# Patient Record
Sex: Female | Born: 1955 | Race: White | Hispanic: No | Marital: Married | Smoking: Never smoker
Health system: Southern US, Community
[De-identification: ages and names within clinical notes are randomized; demographics above are authoritative.]

## PROBLEM LIST (undated history)

## (undated) DIAGNOSIS — K219 Gastro-esophageal reflux disease without esophagitis: Secondary | ICD-10-CM

## (undated) DIAGNOSIS — F419 Anxiety disorder, unspecified: Secondary | ICD-10-CM

## (undated) DIAGNOSIS — K5790 Diverticulosis of intestine, part unspecified, without perforation or abscess without bleeding: Secondary | ICD-10-CM

## (undated) DIAGNOSIS — K222 Esophageal obstruction: Secondary | ICD-10-CM

## (undated) DIAGNOSIS — E7800 Pure hypercholesterolemia, unspecified: Secondary | ICD-10-CM

## (undated) HISTORY — DX: Esophageal obstruction: K22.2

## (undated) HISTORY — DX: Diverticulosis of intestine, part unspecified, without perforation or abscess without bleeding: K57.90

## (undated) HISTORY — PX: CYST EXCISION: SHX5701

## (undated) HISTORY — PX: ECTOPIC PREGNANCY SURGERY: SHX613

## (undated) HISTORY — PX: CHOLECYSTECTOMY: SHX55

---

## 2017-04-07 ENCOUNTER — Emergency Department (HOSPITAL_BASED_OUTPATIENT_CLINIC_OR_DEPARTMENT_OTHER)
Admission: EM | Admit: 2017-04-07 | Discharge: 2017-04-07 | Disposition: A | Discharge status: Home / Self Care | Payer: 59 | Attending: Emergency Medicine | Admitting: Emergency Medicine

## 2017-04-07 ENCOUNTER — Encounter (HOSPITAL_BASED_OUTPATIENT_CLINIC_OR_DEPARTMENT_OTHER): Payer: Self-pay | Admitting: *Deleted

## 2017-04-07 ENCOUNTER — Emergency Department (HOSPITAL_BASED_OUTPATIENT_CLINIC_OR_DEPARTMENT_OTHER): Payer: 59

## 2017-04-07 DIAGNOSIS — Y999 Unspecified external cause status: Secondary | ICD-10-CM | POA: Diagnosis not present

## 2017-04-07 DIAGNOSIS — S4992XA Unspecified injury of left shoulder and upper arm, initial encounter: Secondary | ICD-10-CM | POA: Insufficient documentation

## 2017-04-07 DIAGNOSIS — S0090XA Unspecified superficial injury of unspecified part of head, initial encounter: Secondary | ICD-10-CM | POA: Insufficient documentation

## 2017-04-07 DIAGNOSIS — Z79899 Other long term (current) drug therapy: Secondary | ICD-10-CM | POA: Diagnosis not present

## 2017-04-07 DIAGNOSIS — W01198A Fall on same level from slipping, tripping and stumbling with subsequent striking against other object, initial encounter: Secondary | ICD-10-CM | POA: Insufficient documentation

## 2017-04-07 DIAGNOSIS — S20219A Contusion of unspecified front wall of thorax, initial encounter: Secondary | ICD-10-CM | POA: Diagnosis not present

## 2017-04-07 DIAGNOSIS — Y9301 Activity, walking, marching and hiking: Secondary | ICD-10-CM | POA: Insufficient documentation

## 2017-04-07 DIAGNOSIS — S59912A Unspecified injury of left forearm, initial encounter: Secondary | ICD-10-CM | POA: Diagnosis not present

## 2017-04-07 DIAGNOSIS — Y9289 Other specified places as the place of occurrence of the external cause: Secondary | ICD-10-CM | POA: Insufficient documentation

## 2017-04-07 DIAGNOSIS — S20301A Unspecified superficial injuries of right front wall of thorax, initial encounter: Secondary | ICD-10-CM | POA: Diagnosis present

## 2017-04-07 DIAGNOSIS — S6992XA Unspecified injury of left wrist, hand and finger(s), initial encounter: Secondary | ICD-10-CM | POA: Diagnosis not present

## 2017-04-07 DIAGNOSIS — S0990XA Unspecified injury of head, initial encounter: Secondary | ICD-10-CM

## 2017-04-07 DIAGNOSIS — W19XXXA Unspecified fall, initial encounter: Secondary | ICD-10-CM

## 2017-04-07 DIAGNOSIS — T148XXA Other injury of unspecified body region, initial encounter: Secondary | ICD-10-CM

## 2017-04-07 DIAGNOSIS — S20211A Contusion of right front wall of thorax, initial encounter: Secondary | ICD-10-CM

## 2017-04-07 HISTORY — DX: Gastro-esophageal reflux disease without esophagitis: K21.9

## 2017-04-07 HISTORY — DX: Pure hypercholesterolemia, unspecified: E78.00

## 2017-04-07 HISTORY — DX: Anxiety disorder, unspecified: F41.9

## 2017-04-07 MED ORDER — HYDROCODONE-ACETAMINOPHEN 5-325 MG PO TABS: 2.0000 | ORAL_TABLET | ORAL | 0 refills | Status: AC | PRN

## 2017-04-07 MED ORDER — FENTANYL CITRATE (PF) 100 MCG/2ML IJ SOLN
100.0000 ug | Freq: Once | INTRAMUSCULAR | Status: AC
  Administered 2017-04-07: 100 ug via INTRAMUSCULAR
  Filled 2017-04-07: qty 2

## 2017-04-07 NOTE — ED Notes (Signed)
PA at bedside.

## 2017-04-07 NOTE — ED Triage Notes (Signed)
Pt reports falling when walking today-states that she tripped over something on the ground and had high heels on.  States generalized pain.  Right rib bruising and point tenderness on palpation.  Old bruising noted on abdomen.  Pt reports hitting her head-very small laceration with no bleeding to right forehead-unknown LOC.  Also reports left ring finger pain with bruising and slight swelling.

## 2017-04-07 NOTE — Discharge Instructions (Signed)
He did take pain medication as needed for the pain. A brace may be helpful to help with the rib pain. Apply ice to the area.  Keep the abrasion on her forehead clean and dry. You can apply Neosporin or bacitracin ointment.   Return the emergency Department for any worsening pain, dizziness, confusion, vomiting, loss of consciousness or any other worsening or concerning symptoms.

## 2017-04-07 NOTE — ED Provider Notes (Signed)
[facility] Provider Note   CSN: [id number] Arrival date & time: 04/07/17  1315     History   Chief Complaint Chief Complaint  Patient presents with  . Fall    HPI Meghan Roberts is a 61 y.o. female who presents after a witnessed mechanical fall that occurred at 12:30 pm today. Patient states she was walking when she tripped over her shoe causing her to fall forward on a gravel driveway. She denies any preceding dizziness or chest pain prior to fall. Family states that patient was initially dazed afterward but no true loss of consciousness. The patient is able to remember the details of the fall. Since then, patient reports pain to the right lateral chest wall that is worsened with any movement and deep inspiration. She also reports pain to left shoulder, forearm and left 4th digit. Patient is not currently on any blood thinners. Family and patient deny any abnormal behavior and patient has not had any vomiting since episode. She denies any vision changes, confusion, headache dizziness, chest pain difficulty breathing, abdominal pain, nausea/vomiting.  The history is provided by the patient.    Past Medical History:  Diagnosis Date  . Anxiety   . GERD (gastroesophageal reflux disease)   . Hypercholesteremia     There are no active problems to display for this patient.   Past Surgical History:  Procedure Laterality Date  . CHOLECYSTECTOMY    . CYST EXCISION    . ECTOPIC PREGNANCY SURGERY      OB History    No data available       Home Medications    Prior to Admission medications   Medication Sig Start Date End Date Taking? Authorizing Provider  atorvastatin (LIPITOR) 10 MG tablet Take 10 mg by mouth daily.   Yes [provider]  escitalopram (LEXAPRO) 20 MG tablet Take 20 mg by mouth daily.   Yes [provider]  omeprazole (PRILOSEC) 40 MG capsule Take 40 mg by mouth daily.   Yes [provider]  HYDROcodone-acetaminophen  (NORCO/VICODIN) 5-325 MG tablet Take 2 tablets by mouth every 4 (four) hours as needed. 04/07/17   Maxwell CaulLayden, Lindsey A, PA-C    Family History History reviewed. No pertinent family history.  Social History Social History  Substance Use Topics  . Smoking status: Never Smoker  . Smokeless tobacco: Never Used  . Alcohol use Yes     Allergies   Codeine; Flagyl [metronidazole]; and Penicillins   Review of Systems Review of Systems  Eyes: Negative for visual disturbance.  Respiratory: Negative for shortness of breath.   Cardiovascular: Positive for chest pain (Chest wall).  Gastrointestinal: Negative for abdominal pain, nausea and vomiting.  Musculoskeletal: Negative for back pain and neck pain.  Neurological: Negative for dizziness and headaches.  Psychiatric/Behavioral: Negative for behavioral problems and confusion.     Physical Exam Updated Vital Signs BP 103/63 (BP Location: Left Arm)   Pulse 76   Temp 98.4 F (36.9 C) (Oral)   Resp 18   Ht 5\' 4"  (1.626 m)   Wt 77.1 kg   SpO2 96%   BMI 29.18 kg/m   Physical Exam  Constitutional: She is oriented to person, place, and time. She appears well-developed and well-nourished.  Sitting on examination table. Appears anxious.  HENT:  Head: Normocephalic. Head is with abrasion.  Superficial abrasion to right frontal scalp. No skull deformity or crepitus. No racoon's eyes or battles signs. No hemotympanum bilaterally. No open wounds, lacerations.   Eyes:  Conjunctivae, EOM and lids are normal. Pupils are equal, round, and reactive to light. Right conjunctiva is not injected. Left conjunctiva is not injected.  Neck: Full passive range of motion without pain. No spinous process tenderness present. Normal range of motion present.  Flexion/extension and lateral movement of neck fully intact without difficulty. No midline bony tenderness. No deformity, crepitus.   Cardiovascular: Normal rate, regular rhythm and normal pulses.    Pulses:      Radial pulses are 2+ on the right side, and 2+ on the left side.  Pulmonary/Chest: Effort normal and breath sounds normal. She has no decreased breath sounds. She exhibits tenderness. She exhibits no crepitus, no deformity and no retraction.    Tenderness to palpation to the lateral aspect of the right chest wall. No retractions of the ribs, no deformities, no crepitus.  No evidence of respiratory distress. Able to speak in full sentences without difficulty.   Abdominal: Normal appearance and bowel sounds are normal. There is no tenderness. There is no rigidity, no rebound, no guarding and no CVA tenderness.  Musculoskeletal:       Cervical back: She exhibits no tenderness.       Thoracic back: She exhibits no tenderness.       Lumbar back: She exhibits no tenderness.  No midline tenderness to C, T, or L spine. Normal ROM of RUE. Tenderness to palpation to left shoulder and forearm. Full adduction of LUE without difficulty. Full abduction intact but with subjective reports of pain. No deformity, crepitus to LUE. Tenderness to palpation of the left 4th digit with overlying ecchymosis. FROM of left wrist. Normal right and left lower extremities.   Neurological: She is alert and oriented to person, place, and time. GCS eye subscore is 4. GCS verbal subscore is 5. GCS motor subscore is 6.  Cranial nerves III-XII intact Follows commands, Moves all extremities  5/5 strength to BUE and BLE  Sensation intact throughout  Normal finger to nose. No dysdiadochokinesia. No pronator drift. No slurred speech. No facial droop.   Skin: Skin is warm. Capillary refill takes less than 2 seconds. Abrasion noted.  Superficial abrasion to lateral right chest wall  Psychiatric: Her speech is normal. Her mood appears anxious.  Nursing note and vitals reviewed.   ED Treatments / Results  Labs (all labs ordered are listed, but only abnormal results are displayed) Labs Reviewed - No data to  display  EKG  EKG Interpretation None       Radiology No results found.  Procedures Procedures (including critical care time)  Medications Ordered in ED Medications  fentaNYL (SUBLIMAZE) injection 100 mcg (100 mcg Intramuscular Given 04/07/17 1517)     Initial Impression / Assessment and Plan / ED Course  I have reviewed the triage vital signs and the nursing notes.  Pertinent labs & imaging results that were available during my care of the patient were reviewed by me and considered in my medical decision making (see chart for details).     61 y.o. F who presents after a mechanical fall that occurred at 12:30 pm. Patient was initially dazed after the event but no true LOC. Patient is not currently on blood thinners. Patient is afebrile, non-toxic appearing, sitting comfortably on examination table. Patient is neurovascularly intact. Physical exam with superficial abrasion to right frontal area but otherwise no other wounds, ecchymosis, deformities or crepitus. No neurologic deficits on exam. Low suspicion for ICH given reassuring history/physical exam. Given history/physical exam and with negative [place]  Head CT criteria, head CT is not indicated at this time. Discussed not getting a  CT of the head with family given the CT head criteria and reassuring physical exam, but explained that I would get one if the family and patient wanted one. After lengthy discussion, family is in agreement to not obtaining a head CT at this time. Reassured family that if they changed their mind at any point during the ED course, that we could obtain one. Patient and family expresses understanding and agreement to plan.  Will obtain CXR to eval for rib fracture and LUE XR. History/physical exam are not concerning for pneumothorax secondary to rib fracture. Patient given analgesics in the department.   X-ray reviewed. Chest x-ray negative for any acute fracture. Radiology called and stated that they get a  more specific rib x-ray to evaluate for rib fractures. Rib x-ray reviewed no evidence of fracture or dislocation.Symptoms likely a result of contusion. Upper extremity x-ray showed no acute dislocation or fracture. No fracture noted of the left fourth digit.   Discussed results with patient and family. Patient is now 4 hours post incident. Patient and family deny any nausea/vomiting, confusion, or abnormal behavior throughout ED course. Repeat neuro exam still with no deficits. Will plan to provide splint to left 4th digit for support. Will also provide patient a short course of pain medication for symptomatic relief. Discussed patient's codeine allergy. She denies any history of anaphylaxis but states that codeine makes her sleepy. Instructed patient to follow-up with her primary care doctor in the next 24-48 hours for further evaluation. Strict return precautions discussed. Patient and family expresses understanding and agreement to plan.     Final Clinical Impressions(s) / ED Diagnoses   Final diagnoses:  Fall  Contusion of rib on right side, initial encounter  Abrasion  Minor head injury, initial encounter    New Prescriptions Discharge Medication List as of 04/07/2017  4:36 PM    START taking these medications   Details  HYDROcodone-acetaminophen (NORCO/VICODIN) 5-325 MG tablet Take 2 tablets by mouth every 4 (four) hours as needed., Starting Sun 04/07/2017, Print         Maxwell Caul, PA-C 04/09/17 2243    Alvira Monday, MD 04/10/17 1318

## 2019-02-04 IMAGING — DX DG CHEST 2V
2 series · 2 of 2 positions shown · non-contrast
Comparison: None.

CLINICAL DATA: Fall, right rib pain

EXAM:
CHEST  2 VIEW

[chest pa]
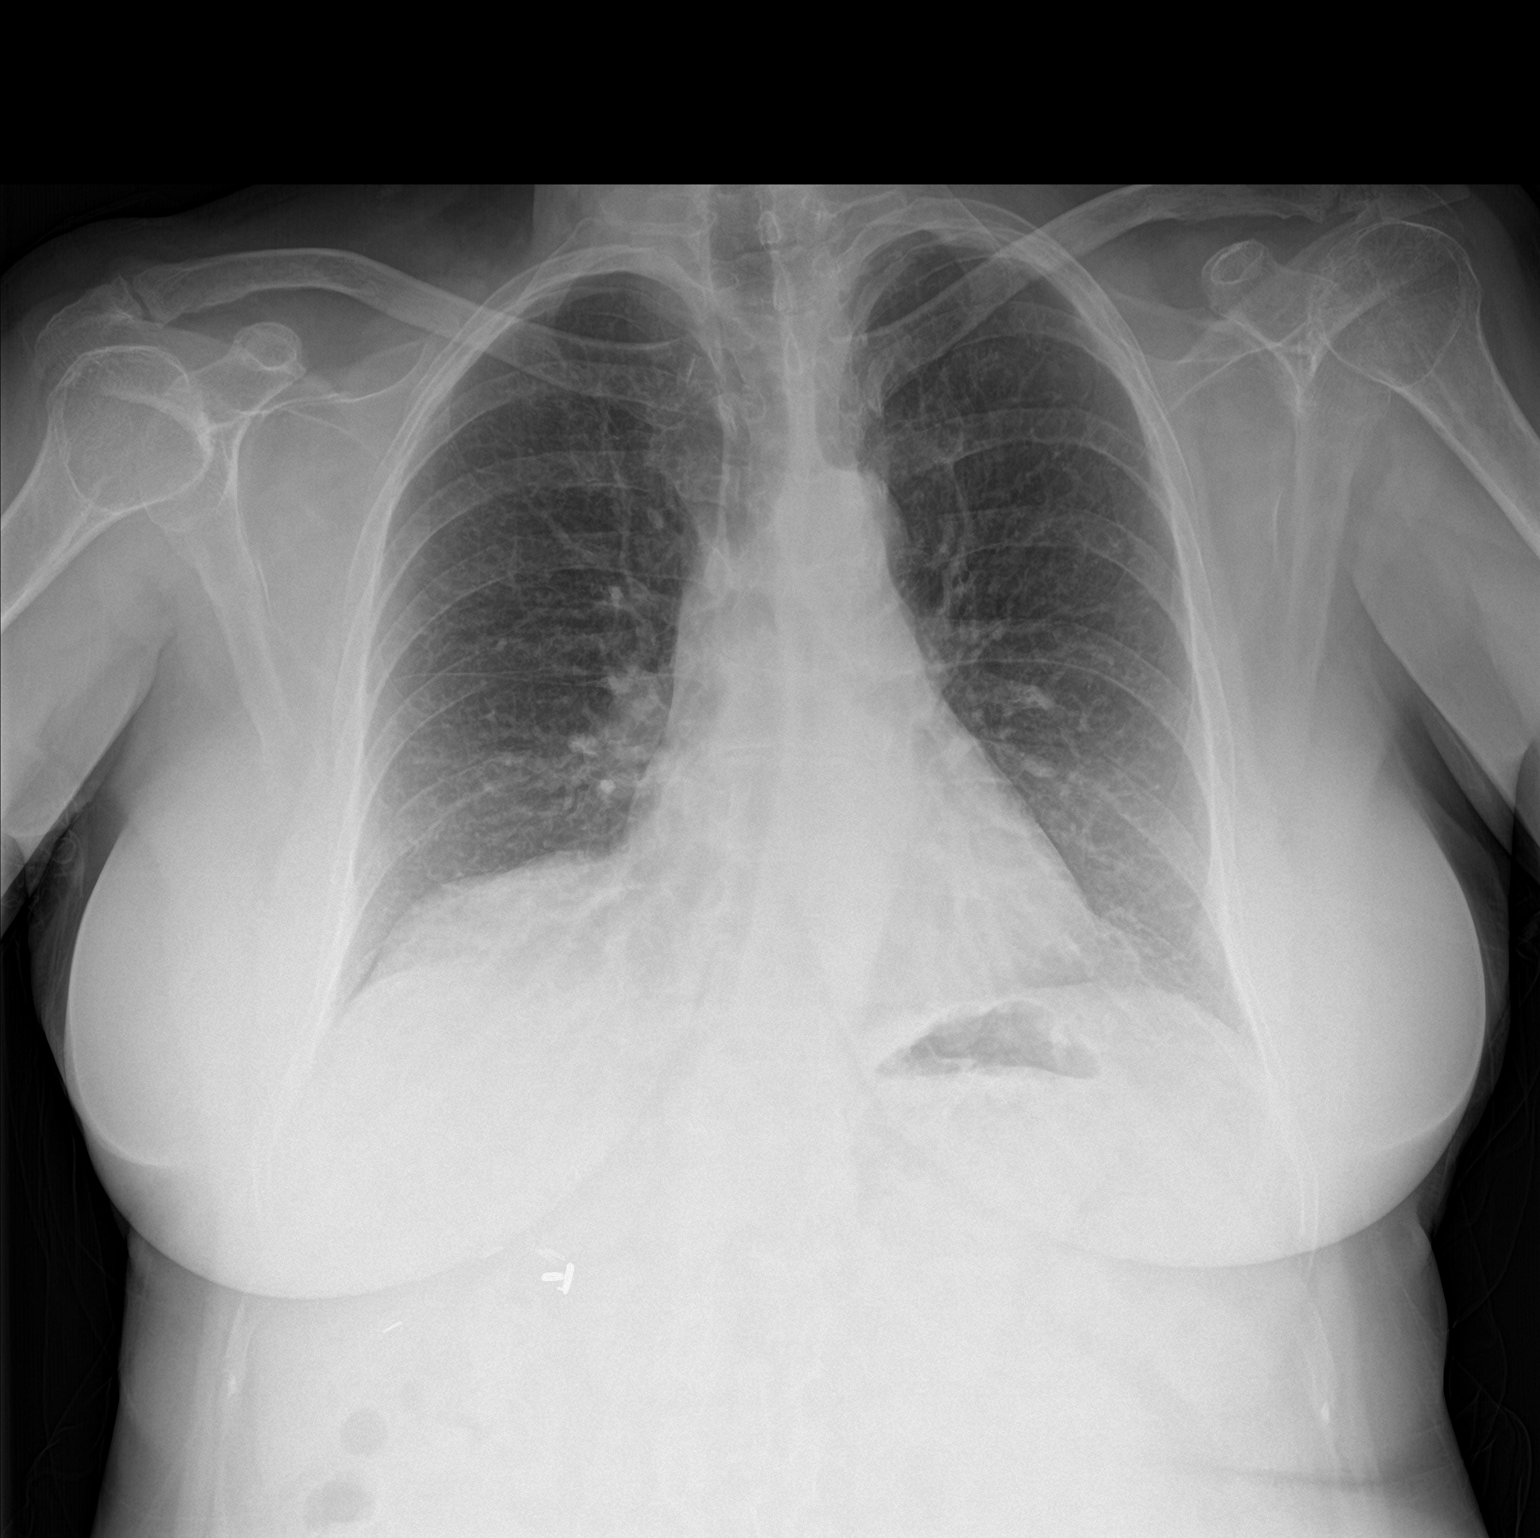

[chest lat]
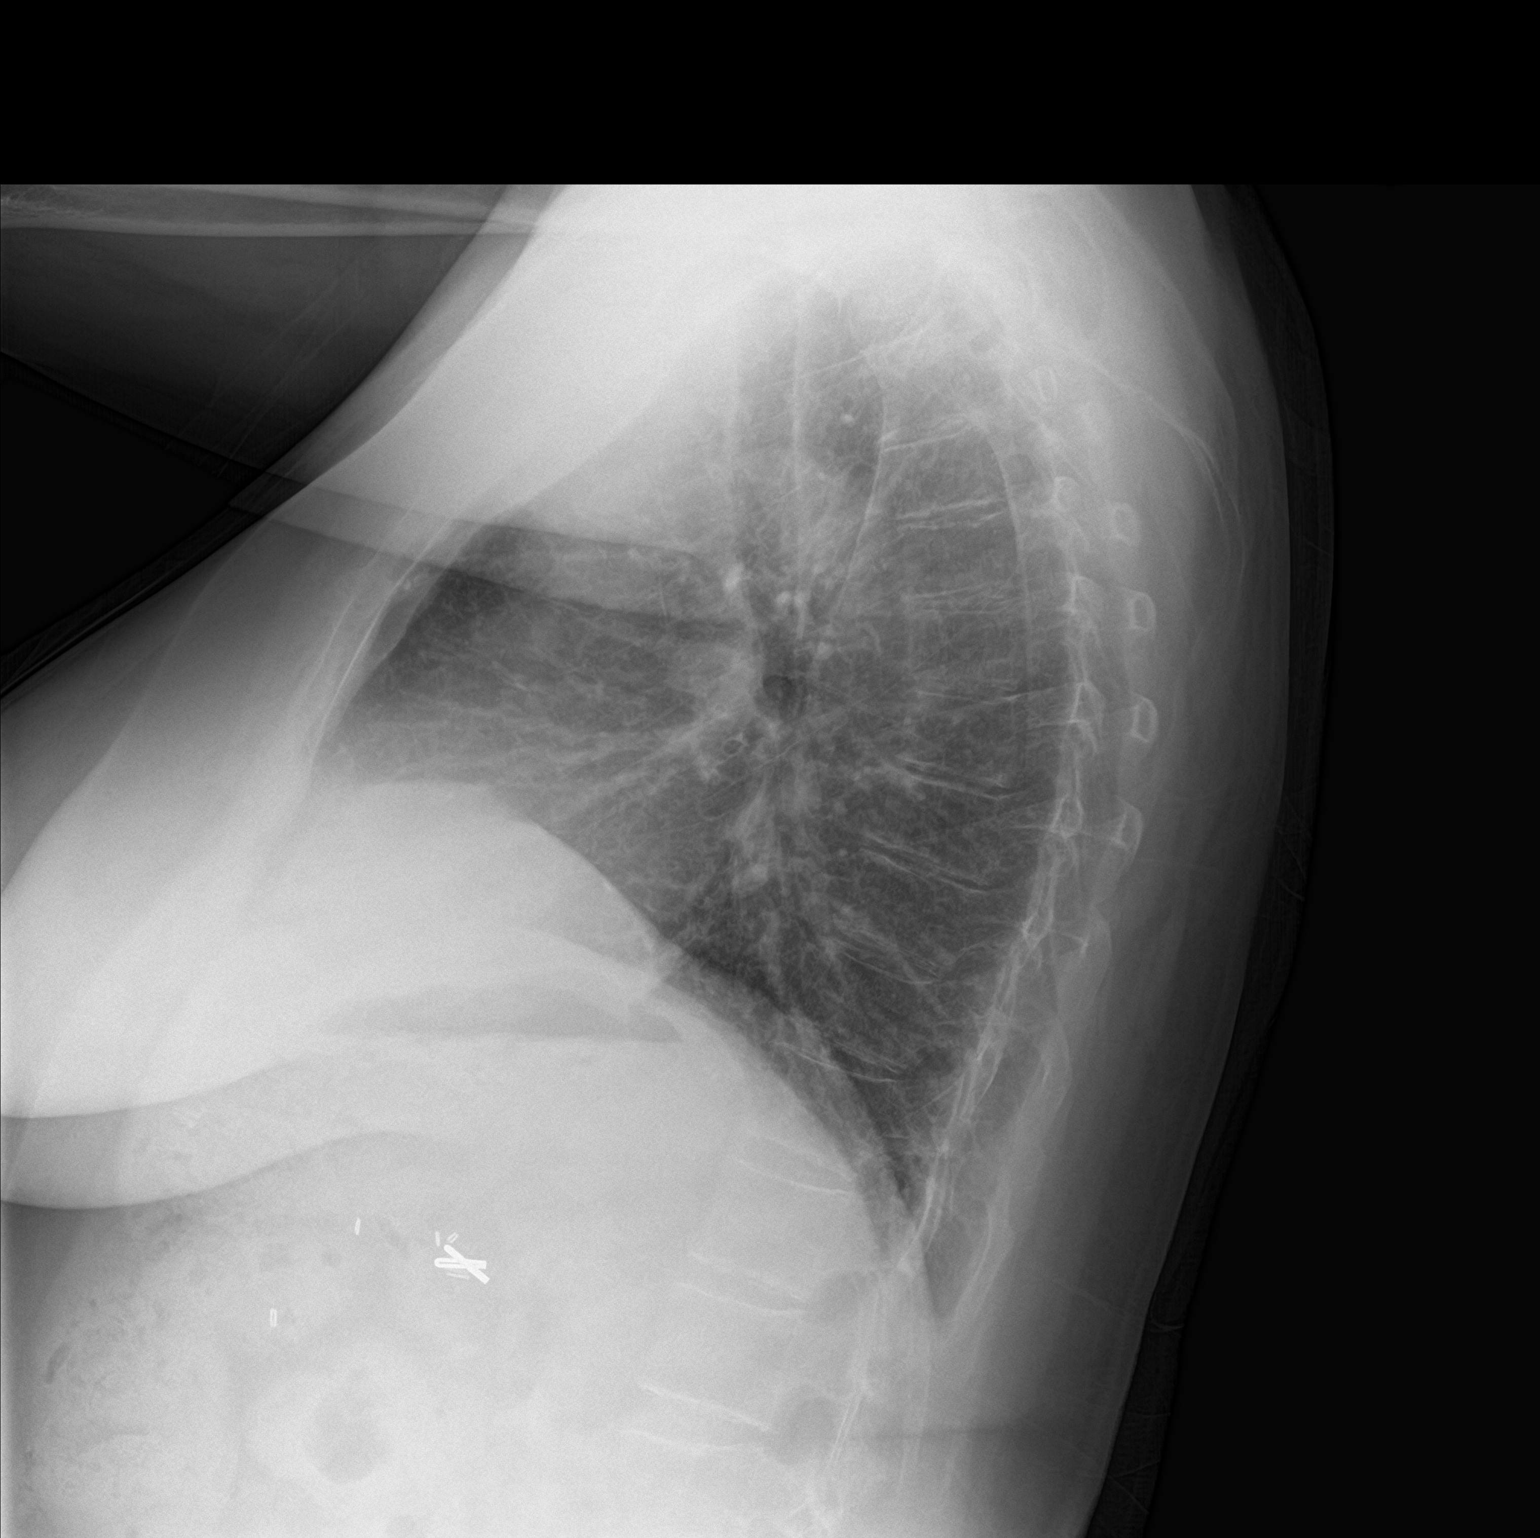

[2 of 2 positions shown; findings below may reference images not displayed]

FINDINGS: Lungs are clear.  No pleural effusion or pneumothorax.

Prominent epicardial fat along the right hemidiaphragm.

The heart is normal in size.

Visualized osseous structures are within normal limits.

Cholecystectomy clips.
IMPRESSION: No evidence of acute cardiopulmonary disease.

## 2019-02-04 IMAGING — DX DG SHOULDER 2+V*L*
3 series · 3 of 3 positions shown · non-contrast
Comparison: None.

CLINICAL DATA: Fall, left shoulder pain.

EXAM:
LEFT SHOULDER - 2+ VIEW

[shoulder grashey]
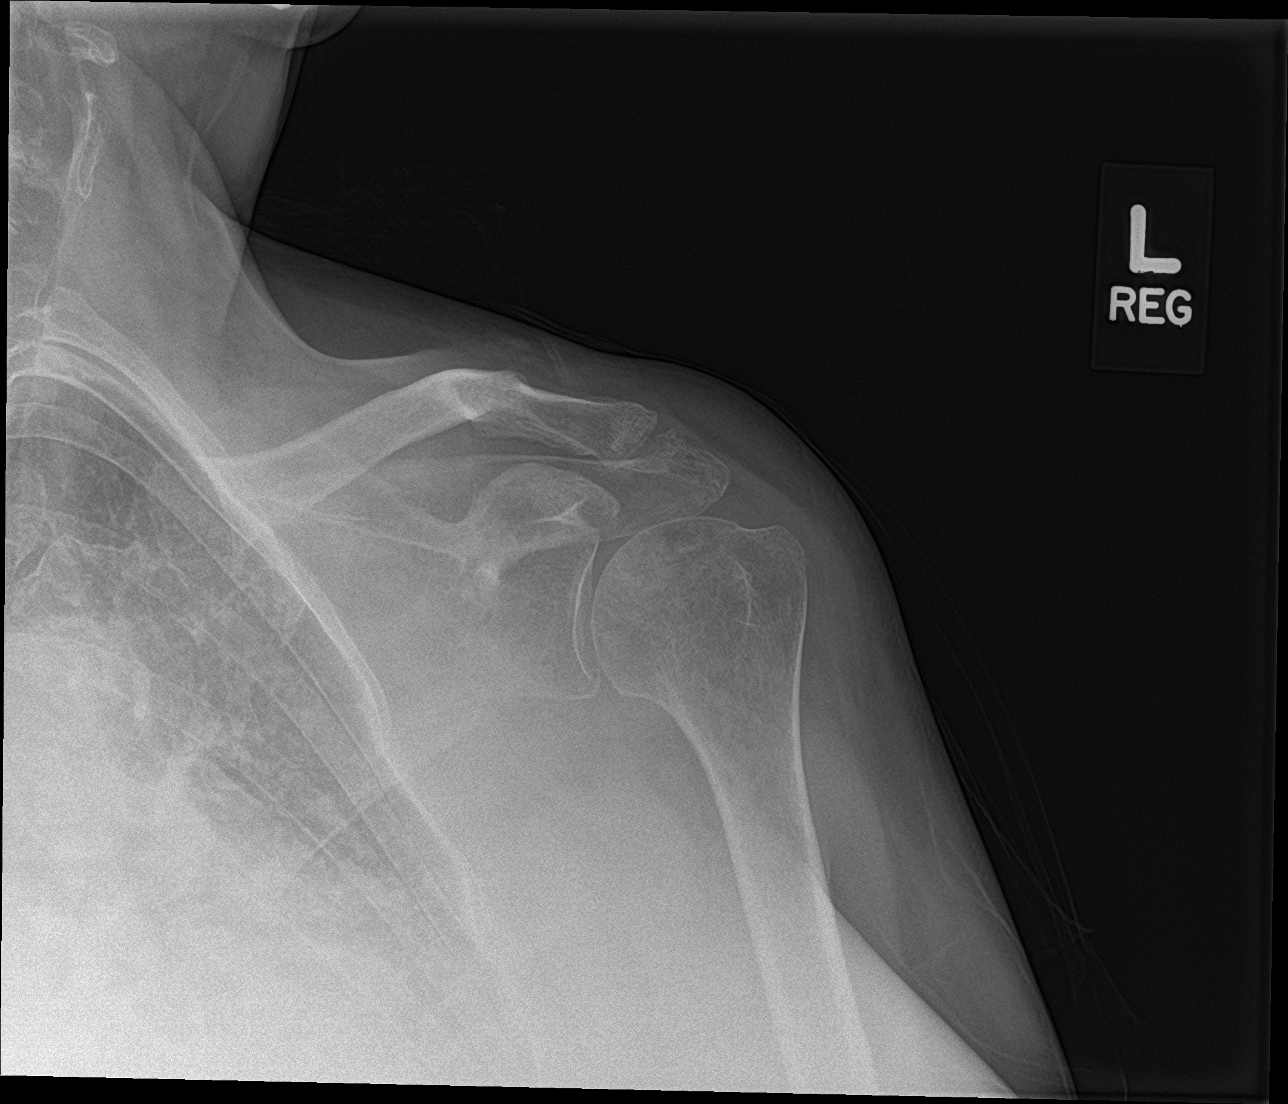

[shoulder y view]
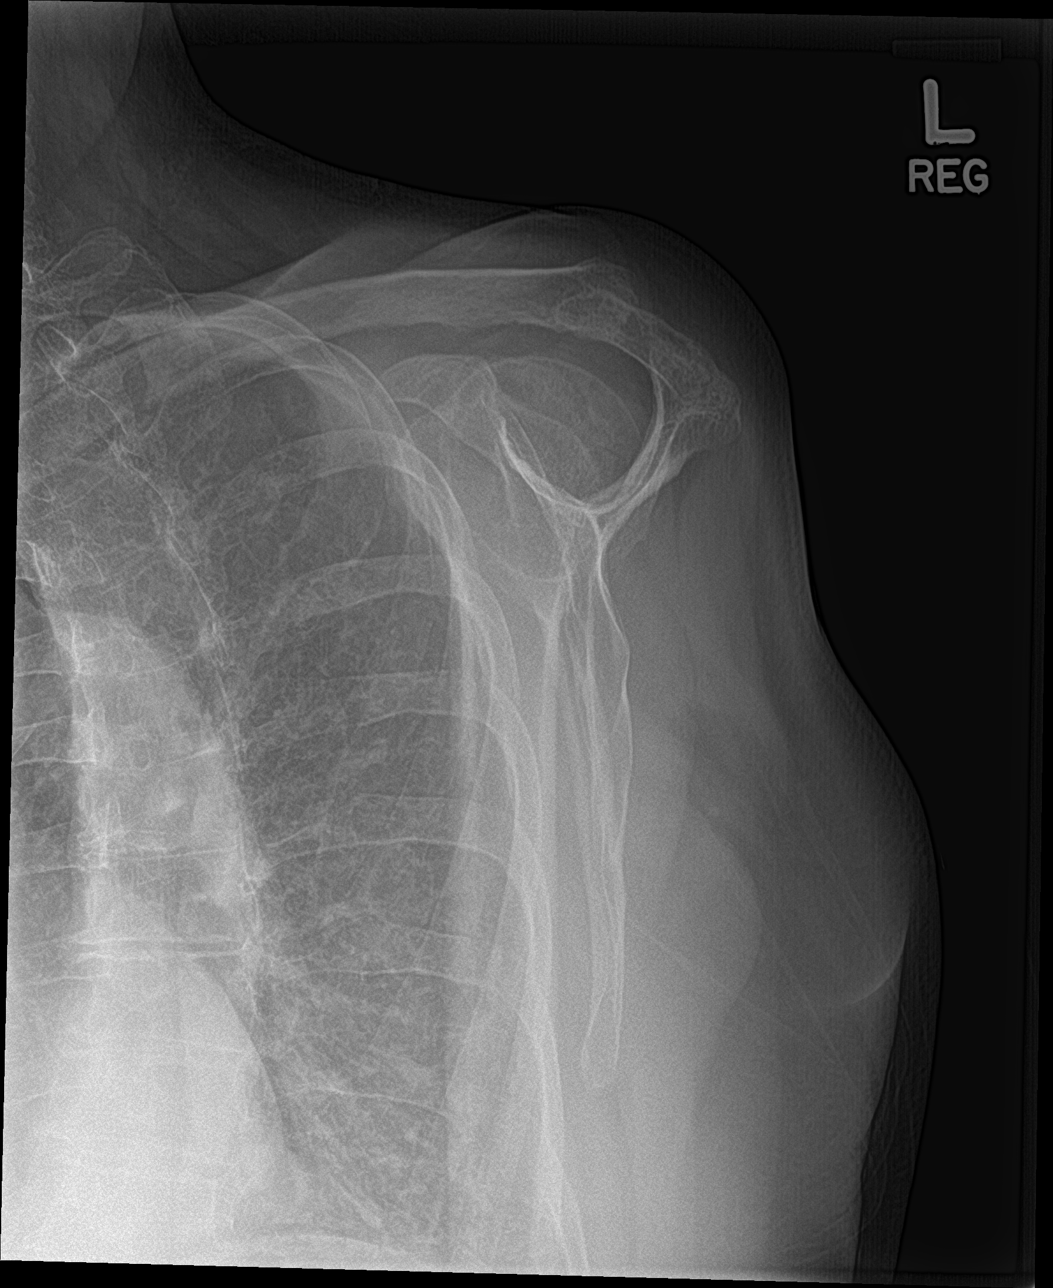

[shoulder axillary]
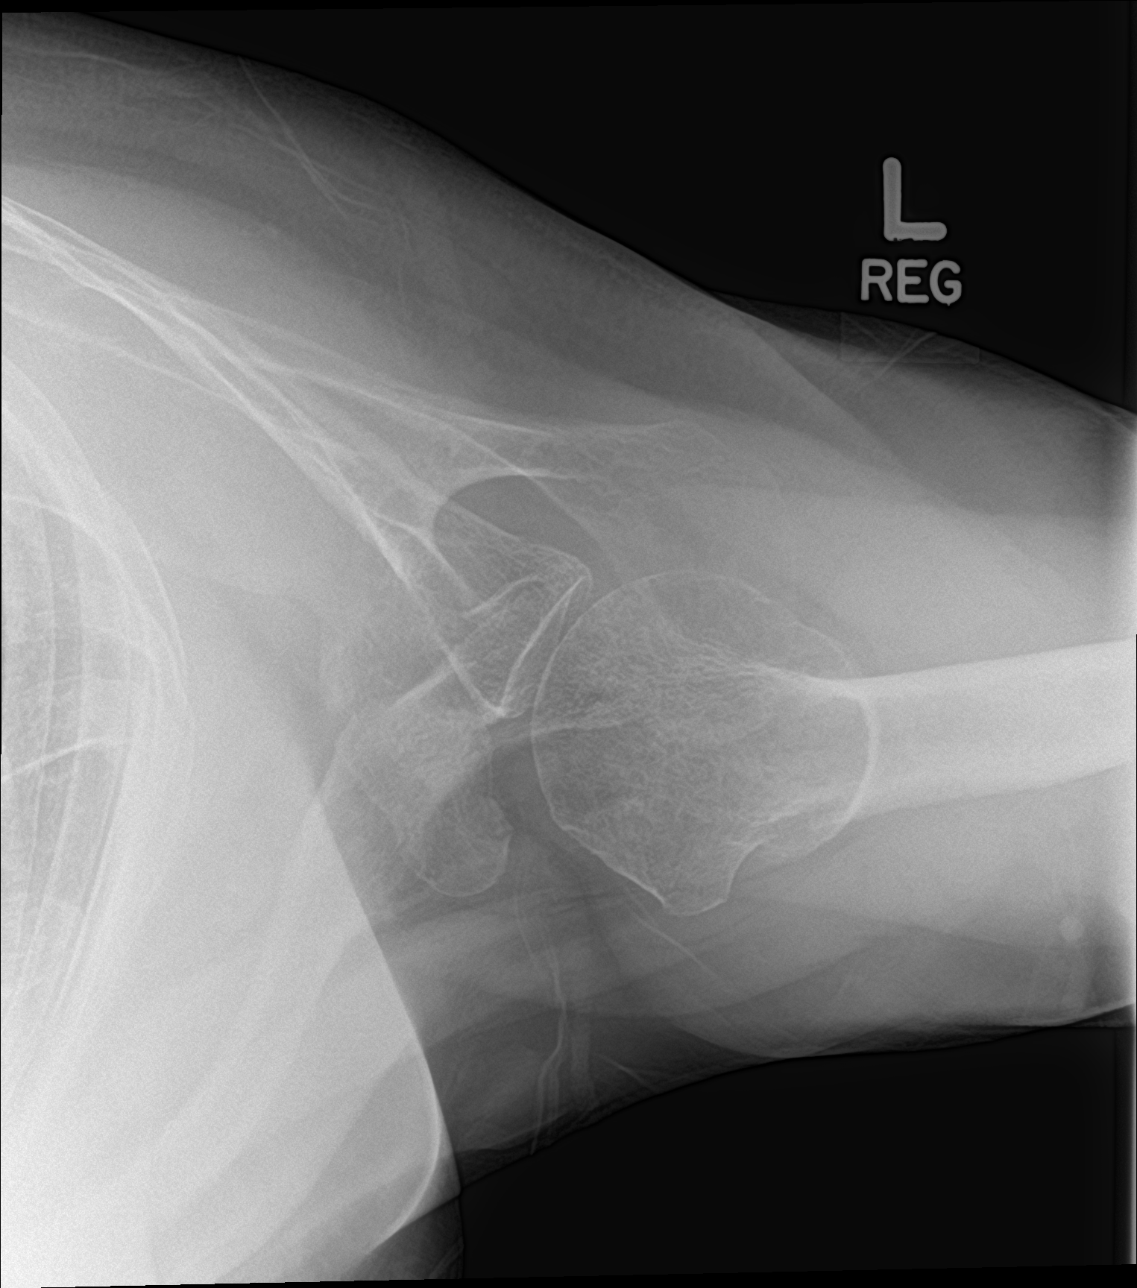

[3 of 3 positions shown; findings below may reference images not displayed]

FINDINGS: There is no evidence of fracture or dislocation. There is no
evidence of arthropathy or other focal bone abnormality. Soft
tissues are unremarkable.
IMPRESSION: Negative.

## 2020-08-26 DIAGNOSIS — K449 Diaphragmatic hernia without obstruction or gangrene: Secondary | ICD-10-CM

## 2020-08-26 DIAGNOSIS — K5010 Crohn's disease of large intestine without complications: Secondary | ICD-10-CM

## 2020-08-26 HISTORY — DX: Diaphragmatic hernia without obstruction or gangrene: K44.9

## 2020-08-26 HISTORY — DX: Crohn's disease of large intestine without complications: K50.10

## 2022-06-21 ENCOUNTER — Telehealth: Payer: Self-pay | Admitting: Gastroenterology

## 2022-06-21 NOTE — Telephone Encounter (Signed)
Okay. I will await records to review and make a recommendation. Thanks

## 2022-06-21 NOTE — Telephone Encounter (Signed)
Good morning Dr. Adela Lank,  (Supervising Doc of the Day 05/22/22)  This patient has had her records forwarded here for a transfer of care.  She moved to [place] from [place] and is seeking to establish care here.  These records will be forwarded to you for your review.  Please let me know if you approve the transfer.  Thank you.

## 2022-06-21 NOTE — Telephone Encounter (Signed)
error 

## 2022-06-26 ENCOUNTER — Encounter: Payer: Self-pay | Admitting: Gastroenterology

## 2022-08-15 ENCOUNTER — Encounter: Payer: Self-pay | Admitting: Gastroenterology

## 2022-09-18 ENCOUNTER — Ambulatory Visit: Payer: 59 | Admitting: Gastroenterology

## 2022-10-04 ENCOUNTER — Ambulatory Visit: Payer: 59 | Admitting: Gastroenterology

## 2022-11-02 ENCOUNTER — Ambulatory Visit: Payer: 59 | Admitting: Gastroenterology

## 2022-12-26 ENCOUNTER — Ambulatory Visit: Payer: Medicare Other | Admitting: Gastroenterology

## 2022-12-26 VITALS — HR 85 | Ht 63.0 in | Wt 176.0 lb

## 2022-12-26 DIAGNOSIS — K5790 Diverticulosis of intestine, part unspecified, without perforation or abscess without bleeding: Secondary | ICD-10-CM

## 2022-12-26 DIAGNOSIS — K219 Gastro-esophageal reflux disease without esophagitis: Secondary | ICD-10-CM

## 2022-12-26 DIAGNOSIS — Z79899 Other long term (current) drug therapy: Secondary | ICD-10-CM | POA: Diagnosis not present

## 2022-12-26 DIAGNOSIS — R1011 Right upper quadrant pain: Secondary | ICD-10-CM

## 2022-12-26 DIAGNOSIS — K529 Noninfective gastroenteritis and colitis, unspecified: Secondary | ICD-10-CM

## 2022-12-26 MED ORDER — MESALAMINE 1.2 G PO TBEC: 2.4000 g | DELAYED_RELEASE_TABLET | Freq: Every day | ORAL | 3 refills | Status: DC

## 2022-12-26 MED ORDER — CITRUCEL PO POWD: 1.0000 | Freq: Every day | ORAL | Status: AC

## 2022-12-26 NOTE — Progress Notes (Signed)
HPI :  67 year old female with a history of colitis, diverticulosis, GERD, recently moved to the area, here to establish care for these issues.  She previously lived in [place] where she was cared for by Dr. Jonni Sanger for these issues.  Back in 2021 she had a colonoscopy for symptoms of rectal bleeding.  She was found to have moderate colitis from 10 to 15 cm from the anal verge and an area of diverticulosis.  Biopsies showed chronic active colitis.  There was a question of whether or not she had colitis related to her diverticulosis versus Crohn's disease.  She unfortunately developed a case of diverticulitis confirmed on CT scan a month after that colonoscopy.  Over time she has been managed with Lialda.  She takes 2 tablets daily and states that has worked pretty well to resolve her bleeding symptoms at the time and she has not had recurrence.  She had a follow-up colonoscopy in November 2022 which showed numerous diverticuli in the sigmoid colon with luminal narrowing and peridiverticular erythema.  Her colon was otherwise normal with a normal ileum.  She was told to continue mesalamine, consider trial of low FODMAP diet for her symptoms.  She states in general she is doing pretty well.  She is not having any bleeding at all.  She has not had recurrent diverticulitis that she is aware of.  She has occasional loose stools and occasional constipated stools.  She has a lot of stress in her life, 6 children and multiple grandkids.  She states her bowels can fluctuate with her stress levels.  She describes the pain in her right upper quadrant that is there all the time and never really goes away.  She has had her gallbladder out in 1979, the symptoms have been ongoing for years.  Her BMI is 31.1, she has had a hard time losing weight over the years, is working on diet.  She has been taking omeprazole 40 mg once daily for history of reflux.  She states this works pretty well for her.  She  states at 1 point she ran out and was using a 20 mg over-the-counter dose of Prilosec which did not work well for her at all.  Symptoms include pyrosis and regurgitation, especially at night.  Dairy makes her symptoms worse, she tries to avoid her food triggers.  She is diabetic and working on weight loss.  She states she has had some dysphagia in the past.  She had an EGD with dilation in October 2021, no Barrett's esophagus.  She states that helped her dysphagia but symptoms are very mild and slowly have come back.  She denies any significant dysphagia, more food taking some time to get through her esophagus with swallows.  She is not interested in pursuing any further dilation right now.  Of note she does have a history of osteoporosis, reports a T-score of -3, has a pending DEXA scan to follow-up.  She is taking calcium with vitamin D.  She moved from [place] to [place] to live near her 2 daughters.  She denies any family history of colon cancer.   Prior endoscopic evaluation: EGD 08/2020 - Dr. Christa See - Shatski ring dilated with 56 Fr Venia Minks, H pylori negative  Colonoscopy 08/2020 - Dr. Christa See moderate colitis sigmoid colon 10-15cm - suspected segmental colitis vs. Crohn's, hyperplastic rectal polyp, diverticulosis - path showed chronic active colitis placed on Lialda  CT scan 09/2020 - sigmoid diverticulitis  Colonoscopy 10/10/21: Jonni Sanger MD -  numerous diverticuli in the sigmoid colon with luminal narrowing, peridiverticular erythema,  normal colon otherwise and ileum  CT scan abdomen pelvis 01/28/21 - negative for any acute pathology  Past Medical History:  Diagnosis Date   Anxiety    Crohn's colitis ([facility]) 08/2020   Dr. Otho Perl [place], [place])   Diverticulosis    GERD (gastroesophageal reflux disease)    Hiatal hernia 08/2020   EGD   Hypercholesteremia    Schatzki's ring      Past Surgical History:  Procedure Laterality Date   CHOLECYSTECTOMY     CYST  EXCISION     ECTOPIC PREGNANCY SURGERY     No family history on file. Social History   Tobacco Use   Smoking status: Never   Smokeless tobacco: Never  Substance Use Topics   Alcohol use: Yes   Drug use: No   Current Outpatient Medications  Medication Sig Dispense Refill   atorvastatin (LIPITOR) 10 MG tablet Take 10 mg by mouth daily.     escitalopram (LEXAPRO) 20 MG tablet Take 20 mg by mouth daily.     HYDROcodone-acetaminophen (NORCO/VICODIN) 5-325 MG tablet Take 2 tablets by mouth every 4 (four) hours as needed. 12 tablet 0   mesalamine (LIALDA) 1.2 g EC tablet Take 2 tablets (2.4 g total) by mouth daily with breakfast. 180 tablet 3   methylcellulose (CITRUCEL) oral powder Take 1 packet by mouth daily.     omeprazole (PRILOSEC) 40 MG capsule Take 40 mg by mouth daily.     VITAMIN D, CHOLECALCIFEROL, PO Take by mouth daily.     No current facility-administered medications for this visit.   Allergies  Allergen Reactions   Codeine    Flagyl [Metronidazole]    Penicillins      Review of Systems: All systems reviewed and negative except where noted in HPI.   Labs reviewed in Care everywhere   Physical Exam: Pulse 85   Ht 5\' 3"  (1.6 m)   Wt 176 lb (79.8 kg)   SpO2 93%   BMI 31.18 kg/m  Constitutional: Pleasant,well-developed, female in no acute distress. HEENT: Normocephalic and atraumatic. Conjunctivae are normal. No scleral icterus. Neck supple.  Cardiovascular: Normal rate, regular rhythm.  Pulmonary/chest: Effort normal and breath sounds normal. No wheezing, rales or rhonchi. Abdominal: Soft, nondistended, nontender. Large scar along RUQ from cholecystectomy. Negative [place].  There are no masses palpable.  Extremities: no edema Lymphadenopathy: No cervical adenopathy noted. Neurological: Alert and oriented to person place and time. Skin: Skin is warm and dry. No rashes noted. Psychiatric: Normal mood and affect. Behavior is normal.   ASSESSMENT: 67 y.o.  female here for assessment of the following  1. Chronic inflammation of colon   2. Diverticulosis   3. Gastroesophageal reflux disease, unspecified whether esophagitis present   4. Long-term current use of proton pump inhibitor therapy   5. Right upper quadrant abdominal pain    Patient with colitis as outlined above, managed with mesalamine over time which has controlled her bleeding and her symptoms for the most part.  Based on review of her prior procedures and notes, this seems more consistent with segmental colitis associated with diverticular disease rather than Crohn's disease.  Seems like she has some functional overlap with possible component of IBS as well.  Generally she is pretty happy with how she is doing however with current dosing of Lialda and we will plan on continuing that.  I refilled this for her today.  We discussed if she wanted  to pursue a fecal calprotectin to check on things, she is not interested in pursuing that given she is feeling well.  I would like to see her at least annually for this issue.  She should contact me if she has any symptoms concerning for recurrent diverticulitis etc.  In regards to her bowel function, she is having some alternating loose stools and constipated stools, recommend trying some Citrucel or Benefiber daily to provide some regularity.  We discussed her history of reflux, she has tried lowering the dose of omeprazole but has recurrent symptoms that bother her.  We discussed long-term risks of omeprazole use.  She does have osteoporosis and understands her risks for fracture are higher than usual with use of PPI.  She understands this but wishes to continue current dosing given reflux impacts her quality of life if she does not treat it.  She does not have Barrett's esophagus.  She will follow-up with her primary care for management of osteoporosis.  Perhaps weight loss would help her reflux and we discussed this for a bit, she will work on this  for these issues and her diabetes.  We have reviewed her abdominal pain which is chronic and persistent, she has a large lap chole scar in the right upper quadrant and pain is around there, suspect she could have some neuropathic /abdominal wall pain there, she does not want any specific therapy for it for now.  Prior imaging reviewed and looks okay.   PLAN: - continue Lialda - 2.4 gm / day - discussed if she wanted to do calprotectin - she declines for now - trial of Citrucel or Benefiber daily - continue omeprazole - discussed risks - she does have osteoporosis but wants to continue current dosing since lower dosing did not help her. No BE - work on weight loss - declines referral to weight loss clinic - suspect abdominal wall pain - mild / chronic, she declines therapy for this now - follow up one year  Meghan Mango, MD [facility]

## 2022-12-26 NOTE — Patient Instructions (Addendum)
If you are age 66 or older, your body mass index should be between 23-30. Your Body mass index is 31.18 kg/m. If this is out of the aforementioned range listed, please consider follow up with your Primary Care Provider.  If you are age 11 or younger, your body mass index should be between 19-25. Your Body mass index is 31.18 kg/m. If this is out of the aformentioned range listed, please consider follow up with your Primary Care Provider.   ________________________________________________________   We have sent the following medications to your pharmacy for you to pick up at your convenience: Lialda 2.4 g daily  Please purchase the following medications over the counter and take as directed: Citrucel or Benefiber daily  Continue omeprazole  Please follow up in 1 year.  Thank you for entrusting me with your care and for choosing [facility], Dr. [place] Cellar

## 2023-02-18 ENCOUNTER — Other Ambulatory Visit: Payer: Self-pay

## 2023-02-18 ENCOUNTER — Telehealth: Payer: Self-pay | Admitting: Gastroenterology

## 2023-02-18 MED ORDER — MESALAMINE 1.2 G PO TBEC: 2.4000 g | DELAYED_RELEASE_TABLET | Freq: Every day | ORAL | 3 refills | Status: AC

## 2023-02-18 NOTE — Telephone Encounter (Signed)
Yes she should take it / resume it, I had seen her about this in January and recommended she stay on it. If she ran out she should let us know and we can refill it, otherwise continue 2 tabs per day. If no improvement despite resuming it she should let us know. Thanks

## 2023-02-18 NOTE — Telephone Encounter (Signed)
Inbound call from patient, states she took herself off the mesalamine in February, stated she has now been experiencing rectal bleeding when she has a bowel movement. Would like to discuss getting back on medication.

## 2023-02-18 NOTE — Telephone Encounter (Signed)
Pt was doing well on mesalamine in February so she decided she could stop it. A week ago pt started seeing BRB in her stool again and on the tissue and there is also mucous in the stool. Pt would like to resume the mesalamine. Please advise.

## 2023-02-18 NOTE — Telephone Encounter (Signed)
Spoke with pt and she is aware of Dr. Doyne Keel recommendations and will resume the med. She knows to let us know if no improvement.

## 2023-12-05 ENCOUNTER — Encounter: Payer: Self-pay | Admitting: Gastroenterology
